# Patient Record
Sex: Female | Born: 1935 | Race: Asian | Hispanic: No | Marital: Married | Smoking: Never smoker
Health system: Southern US, Community
[De-identification: ages and names within clinical notes are randomized; demographics above are authoritative.]

---

## 2009-05-23 ENCOUNTER — Ambulatory Visit: Payer: Self-pay | Admitting: Family Medicine

## 2009-07-09 ENCOUNTER — Ambulatory Visit: Payer: Self-pay | Admitting: Gastroenterology

## 2009-09-12 ENCOUNTER — Ambulatory Visit: Payer: Self-pay | Admitting: Otolaryngology

## 2010-08-09 ENCOUNTER — Ambulatory Visit: Payer: Self-pay | Admitting: Unknown Physician Specialty

## 2010-08-22 ENCOUNTER — Inpatient Hospital Stay: Payer: Self-pay | Admitting: Unknown Physician Specialty

## 2010-10-06 ENCOUNTER — Emergency Department: Payer: Self-pay | Admitting: Emergency Medicine

## 2011-09-10 ENCOUNTER — Ambulatory Visit: Payer: Self-pay | Admitting: Orthopedic Surgery

## 2011-10-06 ENCOUNTER — Ambulatory Visit: Payer: Self-pay | Admitting: Orthopedic Surgery

## 2011-10-06 LAB — PROTIME-INR
INR: 0.9
Prothrombin Time: 12.8 secs (ref 11.5–14.7)

## 2011-10-06 LAB — APTT: Activated PTT: 27.9 secs (ref 23.6–35.9)

## 2011-10-06 LAB — BASIC METABOLIC PANEL
Anion Gap: 9 (ref 7–16)
BUN: 19 mg/dL — ABNORMAL HIGH (ref 7–18)
Calcium, Total: 9.9 mg/dL (ref 8.5–10.1)
Chloride: 104 mmol/L (ref 98–107)
EGFR (African American): 49 — ABNORMAL LOW
Glucose: 111 mg/dL — ABNORMAL HIGH (ref 65–99)
Osmolality: 280 (ref 275–301)
Potassium: 4.7 mmol/L (ref 3.5–5.1)
Sodium: 139 mmol/L (ref 136–145)

## 2011-10-06 LAB — SEDIMENTATION RATE: Erythrocyte Sed Rate: 9 mm/hr (ref 0–30)

## 2011-10-06 LAB — CBC
HCT: 38.4 % (ref 35.0–47.0)
HGB: 13 g/dL (ref 12.0–16.0)
MCHC: 33.9 g/dL (ref 32.0–36.0)
Platelet: 242 10*3/uL (ref 150–440)
RDW: 14.2 % (ref 11.5–14.5)

## 2011-10-06 LAB — MRSA PCR SCREENING

## 2011-10-23 ENCOUNTER — Inpatient Hospital Stay: Payer: Self-pay | Admitting: Orthopedic Surgery

## 2011-10-24 LAB — BASIC METABOLIC PANEL
Anion Gap: 8 (ref 7–16)
BUN: 19 mg/dL — ABNORMAL HIGH (ref 7–18)
Co2: 25 mmol/L (ref 21–32)
EGFR (African American): 50 — ABNORMAL LOW
EGFR (Non-African Amer.): 43 — ABNORMAL LOW
Glucose: 112 mg/dL — ABNORMAL HIGH (ref 65–99)
Osmolality: 279 (ref 275–301)
Sodium: 138 mmol/L (ref 136–145)

## 2011-10-24 LAB — HEMOGLOBIN: HGB: 9.9 g/dL — ABNORMAL LOW (ref 12.0–16.0)

## 2011-10-28 LAB — PATHOLOGY REPORT

## 2012-01-29 ENCOUNTER — Ambulatory Visit: Payer: Self-pay | Admitting: Family Medicine

## 2012-02-09 ENCOUNTER — Ambulatory Visit: Payer: Self-pay | Admitting: Family Medicine

## 2012-04-05 ENCOUNTER — Ambulatory Visit: Payer: Self-pay | Admitting: Orthopedic Surgery

## 2013-05-23 ENCOUNTER — Ambulatory Visit: Payer: Self-pay | Admitting: Family Medicine

## 2014-06-06 NOTE — Op Note (Signed)
PATIENT NAME:  Kristine Parks, Kristine Parks MR#:  [id number] DATE OF BIRTH:  1934-09-09  DATE OF PROCEDURE:  10/23/2011  PREOPERATIVE DIAGNOSIS: Right knee osteoarthritis.   POSTOPERATIVE DIAGNOSIS: Right knee osteoarthritis.   PROCEDURE: Right total knee replacement.   SURGEON: Leitha SchullerMichael J. Terrah Decoster, MD   ANESTHESIA: Spinal.   DESCRIPTION OF PROCEDURE: The patient was brought to the operating room and after adequate spinal anesthesia was obtained, the right leg was prepped and draped in the usual sterile fashion with a tourniquet applied to the upper thigh and a Alvarado legholder utilized. After patient identification and time-out procedures were completed, the leg was exsanguinated with an Esmarch and the tourniquet raised to 275 mmHg. A midline skin incision was made with the knee in flexion followed by a medial parapatellar arthrotomy. Inspection revealed eburnated bone in the medial compartment with significant bone loss of the medial femoral condyle. There was also loss of cartilage on the lateral condyles, femoral and tibial and significant patellar degenerative change as well. The anterior cruciate ligament was excised along with the infrapatellar fat pad and the proximal tibia was exposed to allow for application of the Medacta cutting block. After checking alignment, the proximal tibial cut was carried out and the menisci excised. The femur was exposed in a similar fashion and the cutting block applied. Distal femoral cut was carried out with pins placed in the distal femur for rotational alignment. The 2 cutting guide was applied and anterior, posterior, and chamfer cuts carried out without notching. At this point, the 2 tibia was placed and placed in the appropriate rotation. The proximal drill hole was made along with the keel into the tibia. A 10-mm insert was placed along with a 2 femur. There was some slight laxity in flexion and extension. Going up to the size 12 insert there was excellent stability in  mid flexion, flexion and extension. At this point, the distal femoral drill holes were made along with the notch cut in the trochlea. The patella was cut with the guide and sized to a size 1 patella with three drill holes being made, good coverage with the #1 patella. All trial components were removed at this time as there was excellent stability in flexion and extension and good patellar tracking with no-touch technique. The posterior capsule and arthrotomy were then injected with 0.25% Sensorcaine with epinephrine 30 mL, 15 mg Toradol, and 10 mg of morphine. The bony surfaces were thoroughly irrigated and dried. The tibial component was cemented into place first with excess cement removed followed by placement of the polyethylene component. The femoral component was cemented into place along with the patellar button. Excess cement was removed and after the cement had set, the knee was thoroughly irrigated. There was excellent tracking. The tourniquet was let down for hemostasis and the arthrotomy repaired using a heavy quill suture, 2-0 quill subcutaneously, and skin staples. Xeroform, 4 x 4's, Webril, Ace wrap, and Polar Care device were applied. The patient was then sent to the recovery room in stable condition.   ESTIMATED BLOOD LOSS: 50 mL.  TOURNIQUET TIME:  67 minutes at 275 mmHg.   SPECIMENS: Cut ends of bone.   CONDITION: Stable to recovery room.   COMPLICATIONS: None.   ____________________________ Leitha SchullerMichael J. Eliot Popper, MD mjm:bjt D: 10/23/2011 23:46:36 ET T: 10/24/2011 10:14:03 ET JOB#: [id number]  cc: Leitha SchullerMichael J. Ming Mcmannis, MD, <Dictator> Leitha SchullerMICHAEL J Eunique Balik MD ELECTRONICALLY SIGNED 10/27/2011 7:33

## 2014-06-06 NOTE — Discharge Summary (Signed)
PATIENT NAME:  Kristine Parks, PRAFULLA N MR#:  [id number] DATE OF BIRTH:  [birth year]  DATE OF ADMISSION:  10/23/2011 DATE OF DISCHARGE:  10/27/2011  ADMITTING DIAGNOSIS: Right knee osteoarthritis.   DISCHARGE DIAGNOSIS: Right knee osteoarthritis.   PROCEDURE: Right total knee replacement.   SURGEON: Leitha SchullerMichael J. Menz, MD  ANESTHESIA: Spinal.   ESTIMATED BLOOD LOSS: 50 mL.  TOURNIQUET TIME: 67 minutes at 275 mmHg.   SPECIMENS: Cut ends of bone.   CONDITION: Stable to recovery room.   COMPLICATIONS: None.   HISTORY: The patient is a 80 year old female who underwent a successful left total knee replacement on 08/22/2010 by Dr. Gerrit Heckaliff. She has done very well with the left knee, but she continues to have right knee pain. Over the last year, the right knee pain has progressively gotten worse. Pain is mostly on the inside part of her knee. She has not had any relief with cortisone, Synvisc, anti-inflammatories or narcotic medications. The patient's pain is severe. The pain has limited her activities of daily living such as going to the store and taking care of household chores. The patient has pain with rest as well as with activity. She has had a My Knee CT which confirmed severe right knee osteoarthritis.   PHYSICAL EXAMINATION: GENERAL: Well developed, well nourished female in no apparent distress. Normal affect. Normal gait. No antalgic component. HEENT: Head is normocephalic, atraumatic. Pupils equal are round and reactive to light. She has upper and lower dentures. HEART: Examination of the heart reveals regular rate and rhythm. There is no murmur. There is normal apical pulse. LUNGS: Lungs are clear to auscultation. There is no wheeze, rhonchi, or crackles. There is normal expansion of bilateral chest walls. RIGHT LOWER EXTREMITY: Examination of the right knee shows no effusion, no erythema, and no warmth. No bony abnormalities are noted. There is a varus deformity that is not passively correctable.  She is tender along the medial joint line and nontender along the lateral joint line. There is no noted posterior joint effusion. She has range of motion of 5 to 120 degrees. There is some crepitus on the inside aspect of the knee with flexion and extension. The patient has a negative McMurray's test. There is no retropatellar discomfort. The patient has a negative patella stress test. There is no instability of the knee. The patient has a negative valgus stress test and negative varus stress test. The patient has a negative Lachman test. NEUROLOGIC: There is good quad control. There is no known atrophy. The patient has a negative straight leg raise. The patient has normal sensation to light touch. VASCULAR: The patient has less than 2 second capillary refill. The dorsalis pedis and posterior tibial pulses are intact.   HOSPITAL COURSE: The patient was admitted to the hospital on 10/23/2011. She had surgery that same day and was brought to the orthopedic floor from the PAC-U in stable condition. On postoperative day one, the patient did suffer some dry heaving and nausea, but it was controlled with Zofran and Reglan. KUB was performed which showed negative for any obstructions. The patient was continued on Zofran which controlled her nausea and vomiting and allowed her to have adequate p.o. intake. Throughout the patient's stay she progressed well with physical therapy as well. The patient's vital signs were stable, the patient's labs were stable. On 10/27/2011, the patient was stable and ready for discharge home with home physical therapy.   DISCHARGE INSTRUCTIONS:  The patient may gradually increase weight-bearing on the affected extremity. She  needs to elevate the effected foot and leg on two pillows with the foot higher than the knee and knee-high TED hose on both legs and remove at bedtime, replace when arising the next morning.   Diet: She may resume a regular diet.   Medications: Lovenox 40 mg  subcutaneous once a day and resume typical home medications.   Pain medications: Ultram 1 to 2 tablets every six hours as needed for pain as well as oxycodone 5 to 10 mg every four hours as needed for pain.   Wound care: Continue using the Polar Care unit maintaining temperature between 40 and 50 degrees Fahrenheit. Do not get the dressing or bandage wet or dirty. Call [facility] orthopedics if the dressing gets any water under it. Leave the dressing on.   Symptoms to report: Call [facility] if any of the following occur - bright red bleeding from the incision wound, fever above 101.5 degrees, redness, swelling, or drainage at the incision. Call [facility] orthopedics if you experience any increased leg pain, numbness or weakness in your legs or bowel or bladder symptoms.   REFERRALS: Home physical therapy has been arranged for continuation of rehab program. She is to call [facility] if she has not made contact with them within 48 hours of her return home. She has a follow-up appointment with [facility] in two weeks. She needs to call and schedule an appointment.   DISCHARGE MEDICATIONS:  1. Diovan 160 mg oral tablet one tablet orally once a day at bedtime.  2. Omeprazole 20 mg oral delayed-release tablet one tablet orally once a day in the morning.  3. Amlodipine 5 mg oral tablet one tablet orally once a day at bedtime. 4. Digoxin 125 mcg oral tablet one tablet orally once a day at bedtime. 5. Timolol hemihydrate 0.5% ophthalmic solution one drop to each affected eye once a day at bedtime.  6. Atorvastatin 40 mg oral tablet one tablet orally once a day at bedtime. ____________________________ Evon Slack, PA-C tcg:slb D: 10/27/2011 07:54:01 ET T: 10/28/2011 11:58:30 ET JOB#: [id number]  cc: Evon Slack, PA-C, <Dictator> Evon Slack [place] ELECTRONICALLY SIGNED 11/18/2011 13:48

## 2014-07-06 ENCOUNTER — Other Ambulatory Visit: Payer: Self-pay | Admitting: Family Medicine

## 2014-07-06 DIAGNOSIS — Z0000 Encounter for general adult medical examination without abnormal findings: Secondary | ICD-10-CM

## 2014-07-20 ENCOUNTER — Ambulatory Visit: Payer: Self-pay | Attending: Family Medicine

## 2015-01-25 ENCOUNTER — Other Ambulatory Visit: Payer: Self-pay | Admitting: Podiatry

## 2015-01-25 DIAGNOSIS — R600 Localized edema: Secondary | ICD-10-CM

## 2015-01-26 ENCOUNTER — Ambulatory Visit
Admission: RE | Admit: 2015-01-26 | Discharge: 2015-01-26 | Disposition: A | Discharge status: Home / Self Care | Payer: Medicare Other | Source: Ambulatory Visit | Attending: Podiatry | Admitting: Podiatry

## 2015-01-26 DIAGNOSIS — R600 Localized edema: Secondary | ICD-10-CM | POA: Insufficient documentation

## 2016-04-21 ENCOUNTER — Emergency Department
Admission: EM | Admit: 2016-04-21 | Discharge: 2016-04-21 | Disposition: A | Discharge status: Home / Self Care | Payer: Medicare Other | Attending: Emergency Medicine | Admitting: Emergency Medicine

## 2016-04-21 ENCOUNTER — Encounter: Payer: Self-pay | Admitting: Emergency Medicine

## 2016-04-21 DIAGNOSIS — F4321 Adjustment disorder with depressed mood: Secondary | ICD-10-CM

## 2016-04-21 DIAGNOSIS — F4320 Adjustment disorder, unspecified: Secondary | ICD-10-CM | POA: Insufficient documentation

## 2016-04-21 MED ORDER — LORAZEPAM 0.5 MG PO TABS: 0.5000 mg | ORAL_TABLET | Freq: Three times a day (TID) | ORAL | 0 refills | Status: AC | PRN

## 2016-04-21 MED ORDER — LORAZEPAM 0.5 MG PO TABS
0.5000 mg | ORAL_TABLET | Freq: Once | ORAL | Status: AC
  Administered 2016-04-21: 0.5 mg via ORAL
  Filled 2016-04-21: qty 1

## 2016-04-21 NOTE — ED Notes (Signed)
Pt up to bathroom with minimal assist, co feeling weak but states ready to go back to CCU and be with husband. Pt is calm at this time without co pain.

## 2016-04-21 NOTE — ED Notes (Signed)
Pt still hysterical. Not gagging or nauseated but still very anxious. Family remains at bedside. Attempted hindi interpretor via phone but they are unable to understand pt. Used interpretor to make sure son understands care even though speaks english.

## 2016-04-21 NOTE — ED Triage Notes (Signed)
Pt brought from ICU hysterical because her husband just passed away. Pt has been screaming and retching (not productive) since she arrived in triage. Pt is alert & accompanied by her sons.

## 2016-04-21 NOTE — ED Notes (Signed)
Param (grandson) [phone]

## 2016-04-21 NOTE — Discharge Instructions (Signed)
You have been seen in the Emergency Department (ED) today for grief and the death of your husband. We are sorry you have had to go through this.  Follow up with your doctor as soon as possible regarding today's ED visit.   Please follow up any other recommendations and clinic appointments provided by the psychiatry team that saw you in the Emergency Department.  No driving today.

## 2016-04-21 NOTE — ED Notes (Signed)
Report received from Valerie RN. Patient care assumed. Patient/RN introduction complete. Will continue to monitor.  

## 2016-04-21 NOTE — ED Notes (Signed)
Pt sleeping. 

## 2016-04-21 NOTE — ED Notes (Addendum)
Patient discharged to home per MD order. Patient in stable condition, and deemed medically cleared by ED provider for discharge. Discharge instructions reviewed with patient/family using "Teach Back"; verbalized understanding of medication education and administration, and information about follow-up care. Denies further concerns. Instructions given with family at bedside to translate.

## 2016-04-21 NOTE — ED Provider Notes (Addendum)
[facility] Emergency Department Provider Note   ____________________________________________   First MD Initiated Contact with Patient 04/21/16 1557     (approximate)  I have reviewed the triage vital signs and the nursing notes.   HISTORY  Chief Complaint grief  Hindu interpreter utilize, but the patient due to crying is not able to give a history. She appears quite distraught. EM caveat  HPI Kristine Parks is a 82 y.o. female who unfortunately lost her husband today in the NICU. Upon learning of his death, family reports that she became distraught and crying, and speaking frequently about how she will be able to get from one place other, she one have anyone to help her through life.  The patient's son reports that he too is sad about his father's death, but he is concerned because his mother is crying hysterically. She will not calm down.  The patient reports that she is not in pain. She reports the interpreter that she is "extremely upset" and very sad. Denies any chest pain or trouble breathing. No numbness weakness or headache. She continues to talk about how said she is, and how difficult it will be living without her husband. She is not suicidal or homicidal. She is grieving according to her family.   History reviewed. No pertinent past medical history. High blood pressure according to family There are no active problems to display for this patient.   History reviewed. No pertinent surgical history.  Prior to Admission medications   Medication Sig Start Date End Date Taking? Authorizing Provider  LORazepam (ATIVAN) 0.5 MG tablet Take 1 tablet (0.5 mg total) by mouth every 8 (eight) hours as needed for anxiety. 04/21/16 04/23/16  Sharyn Creamer, MD  Takes blood pressure medicine according to family.  Allergies Patient has no known allergies.  History reviewed. No pertinent family history.  Social History Social History  Substance Use Topics    . Smoking status: Never Smoker  . Smokeless tobacco: Never Used  . Alcohol use No    Review of Systems  She has not been sick recently. Family reports she was in her normal health except for being anxious about her husband, and then suddenly became distraught when learning he had died.    ____________________________________________   PHYSICAL EXAM:  VITAL SIGNS: ED Triage Vitals [04/21/16 1541]  Enc Vitals Group     BP (!) 199/52     Pulse Rate 72     Resp 18     Temp 98.2 F (36.8 C)     Temp Source Oral     SpO2 99 %     Weight 100 lb (45.4 kg)     Height 4\' 10"  (1.473 m)     Head Circumference      Peak Flow      Pain Score      Pain Loc      Pain Edu?      Excl. in GC?     Constitutional: Alert and oriented. Very distraught, tearful and crying.  Eyes: Conjunctivae are normal. PERRL. EOMI. Head: Atraumatic. Nose: No congestion/rhinnorhea. Mouth/Throat: Mucous membranes are moist.  Oropharynx non-erythematous. Neck: No stridor.   Cardiovascular: Normal rate, regular rhythm. Grossly normal heart sounds.  Good peripheral circulation. Respiratory: Normal respiratory effort.  No retractions. Lungs CTAB. Gastrointestinal: Soft and nontender. No distention.  Musculoskeletal: No lower extremity tenderness nor edema.  No joint effusions. Moves all extremities normally with normal strength. Neurologic:  Normal speech and language and  according to her family as there is a language barrier for me. No gross focal neurologic deficits are appreciated. No gait instability. Skin:  Skin is warm, dry and intact. No rash noted. Psychiatric: Mood and affect are tearful and rather distraught.  ____________________________________________   LABS (all labs ordered are listed, but only abnormal results are displayed)  Labs Reviewed - No data to display ____________________________________________  EKG  Reviewed and interpreted by me and 1625 Rate 90 Care is a QTc 4:30 Some  baseline artifact Normal sinus rhythm, no evidence of ischemia or ectopy noted. Of note the patient denies any chest pain or trouble breathing or other cardiac complaint. She is however crying at the time EKG is performed. ____________________________________________  RADIOLOGY   ____________________________________________   PROCEDURES  Procedure(s) performed: None  Procedures  Critical Care performed: No  ____________________________________________   INITIAL IMPRESSION / ASSESSMENT AND PLAN / ED COURSE  Pertinent labs & imaging results that were available during my care of the patient were reviewed by me and considered in my medical decision making (see chart for details).  Patient presents tearful crying and very emotionally upset suddenly after learning the death of her husband.  Family supportive of her. She denies any acute medical conditions the interpreter, but feels very very sad about the loss of her husband. She has not exhibited any acute psychiatric symptoms such as abnormal behavior or suicidal ideation. She denies any cardiac or pulmonary complaint. There is obvious that she is obviously grieving the loss of her husband severely at this time.  Clinical Course as of Apr 22 2002  Mon Apr 21, 2016  1729 Patient is very tearful, continues to be distraught. She has calm some and is no longer gagging, and appears calmer but still very upset. Family requesting some additional medicine to help her, and she is fully alert I will give her additional dose of Ativan going slowly given her age, weight, and wish to avoid side effects from this medication.  [MQ]  1926 Patient calm, vitals improved. Resting comfortably. Much improved from previous. She reports she sat, but no longer appears in significant grief or distress.  [MQ]    Clinical Course User Index [MQ] Sharyn Creamer, MD    ----------------------------------------- 8:02 PM on  04/21/2016 -----------------------------------------  The patient is calm, resting comfortably with improvement vital signs. She has no complaints right now, she does feel sad but this is to be expected. I discussed with her family including her son, and he discussed with the patient and she does not wish for an interpreter for discharge, but wishes to be discharged now to go with her son with the plan to likely go visit the ICU again. I will prescribe her a few tablets of Ativan as an as needed for the next couple of days if she develops anxiety grieving recurrence of her symptoms today.  Much improved. Vital signs improved and stable. Will follow-up with her regular doctor and has support of her close family. ____________________________________________   FINAL CLINICAL IMPRESSION(S) / ED DIAGNOSES  Final diagnoses:  Grief reaction      NEW MEDICATIONS STARTED DURING THIS VISIT:  New Prescriptions   LORAZEPAM (ATIVAN) 0.5 MG TABLET    Take 1 tablet (0.5 mg total) by mouth every 8 (eight) hours as needed for anxiety.     Note:  This document was prepared using Dragon voice recognition software and may include unintentional dictation errors.     Sharyn Creamer, MD 04/21/16 2004  Please note a Hindi interpreter was utilized during my examination and history taking, with the addition of assistance from a patient's family as well    Sharyn CreamerMark Quale, MD 04/21/16 2008

## 2018-12-06 ENCOUNTER — Other Ambulatory Visit: Payer: Self-pay

## 2018-12-06 ENCOUNTER — Encounter: Payer: Medicare Other | Attending: Family Medicine | Admitting: Dietician

## 2018-12-06 VITALS — Ht 60.0 in | Wt 103.6 lb

## 2018-12-06 DIAGNOSIS — E119 Type 2 diabetes mellitus without complications: Secondary | ICD-10-CM | POA: Diagnosis not present

## 2018-12-06 DIAGNOSIS — I10 Essential (primary) hypertension: Secondary | ICD-10-CM | POA: Insufficient documentation

## 2018-12-06 NOTE — Progress Notes (Signed)
Medical Nutrition Therapy: Visit start time: 1330  end time: 1415  Assessment:  Diagnosis: Type 2 diabetes, HTN Past medical history: same as above Psychosocial issues/ stress concerns: none  Preferred learning method:  . Auditory   Current weight: 103.6lbs Height: 4'6"  Medications, supplements: reconciled list in medical record  Progress and evaluation:  Patient has been checking BG in evening daily; all results have been under 150mg /dl.  Patient reports eating small meals and generally avoids sweets.  Patient follows vegetarian diet and is eating only small portions of starches, larger portions of low-carb vegetables.   Physical activity: ADLs  Dietary Intake:  Usual eating pattern includes 3 meals and 1 snacks per day. Dining out frequency: 0 meals per week.  Breakfast: milk Snack: none Lunch: chapati -- pita bread, sometimes rice, veg cauliflower, spinach with spices does not eat meat Snack: dried chickpeas, spiced Supper: same as lunch Snack: none Beverages: milk, water, juice  Nutrition Care Education: Topics covered: diabetes Basic nutrition: basic food groups, appropriate nutrient balance, appropriate meal and snack schedule, general nutrition guidelines    Diabetes:  goals for BGs, appropriate meal and snack schedule, importance of controlling portions of high-carbohydrate foods to 1/4 plate or less; role of fiber, protein, fat; advised avoiding/ limiting deep fried foods and sweets  Hypertension:  identifying food sources of potassium, magnesium   Nutritional Diagnosis:  [place]-2.2 Altered nutrition-related laboratory As related to Type 2 diabetes, hypertension.  As evidenced by patient with recent HbA1C of 5.7% while taking diabetes medication, recent elevated BP.  Intervention:   Instruction as noted above.  Patient seems to be following a healthy eating pattern that should promote healthy blood sugars and blood pressure.   Patient's son requested a blood sugar  check; result was 76mg /dl at 2:15pm -- advised eating a snack to avoid low BG.   Patient had another appointment elsewhere so visit was kept to 45 minutes.   Education Materials given:  . Plate Planner with food lists . [place] [employer]: [employer] Guide, Gujarati Cuisine  Learner/ who was taught:  . Patient  . Family member: son   Level of understanding: Marland Kitchen Verbalizes/ demonstrates competency   Demonstrated degree of understanding via:   Teach back Learning barriers: . Language: patient speaks Gujarati; interpreter Reymundo Poll assisted with visit   Willingness to learn/ readiness for change: . Eager, change in progress  Monitoring and Evaluation:  Dietary intake, exercise, BG control, BP control, and body weight      follow up: prn

## 2020-01-03 ENCOUNTER — Ambulatory Visit: Payer: Medicare Other | Admitting: Gastroenterology

## 2020-03-19 ENCOUNTER — Other Ambulatory Visit: Payer: Self-pay

## 2020-03-19 ENCOUNTER — Ambulatory Visit (INDEPENDENT_AMBULATORY_CARE_PROVIDER_SITE_OTHER): Payer: Medicare Other | Admitting: Urology

## 2020-03-19 VITALS — BP 187/90 | HR 68 | Ht 60.0 in | Wt 97.0 lb

## 2020-03-19 DIAGNOSIS — R339 Retention of urine, unspecified: Secondary | ICD-10-CM | POA: Diagnosis not present

## 2020-03-19 DIAGNOSIS — R39198 Other difficulties with micturition: Secondary | ICD-10-CM | POA: Diagnosis not present

## 2020-03-19 LAB — MICROSCOPIC EXAMINATION
Bacteria, UA: NONE SEEN
RBC: NONE SEEN /hpf (ref 0–2)

## 2020-03-19 LAB — URINALYSIS, COMPLETE
Bilirubin, UA: NEGATIVE
Glucose, UA: NEGATIVE
Ketones, UA: NEGATIVE
Leukocytes,UA: NEGATIVE
Nitrite, UA: NEGATIVE
Protein,UA: NEGATIVE
RBC, UA: NEGATIVE
Specific Gravity, UA: 1.01 (ref 1.005–1.030)
Urobilinogen, Ur: 0.2 mg/dL (ref 0.2–1.0)
pH, UA: 5 (ref 5.0–7.5)

## 2020-03-19 LAB — BLADDER SCAN AMB NON-IMAGING: Scan Result: 14

## 2020-03-19 NOTE — Progress Notes (Signed)
03/19/2020 1:09 PM   Kristine Parks 1934-08-06 [id number]  Referring provider: Cyndia Diver, PA-C [address] [place],  [place] [address]  No chief complaint on file.   HPI: Consulted to assess the patient for symptoms.  Translator was utilized.  History was difficult.  It appears she has a slower flow was stopping starting but she is not bothered a lot by it.  She is continent.  She voids every 2 hours during the day gets up once or twice a night.  She is continent.  She is on oral hypoglycemics  No history of bladder surgery or infections   PMH: No past medical history on file.  Surgical History: No past surgical history on file.  Home Medications:  Allergies as of 03/19/2020      Reactions   Doxycycline Other (See Comments)   Tramadol Other (See Comments)      Medication List       Accurate as of March 19, 2020  1:09 PM. If you have any questions, ask your nurse or doctor.        amLODipine 5 MG tablet Commonly known as: NORVASC Take 5 mg by mouth 2 (two) times daily.   aspirin EC 81 MG tablet Take by mouth.   atorvastatin 40 MG tablet Commonly known as: LIPITOR TAKE 1 TABLET BY MOUTH EVERY DAY   cetirizine 10 MG tablet Commonly known as: ZYRTEC Take by mouth.   digoxin 0.125 MG tablet Commonly known as: LANOXIN TAKE 1 TABLET (0.125 MG TOTAL) BY MOUTH ONCE DAILY.   fluticasone 50 MCG/ACT nasal spray Commonly known as: FLONASE ONE SPRAYS IN EACH NOSTRIL ONCE DAILY   gabapentin 100 MG capsule Commonly known as: NEURONTIN TAKE 1 CAPSULE BY MOUTH THREE TIMES A DAY   glucose blood test strip USE 1 EACH 2 (TWO) TIMES DAILY. ONE TOUCH ULTRA TEST STRIPS. DX CODE E11.9   hydrALAZINE 25 MG tablet Commonly known as: APRESOLINE Take 25 mg by mouth daily.   metFORMIN 500 MG tablet Commonly known as: GLUCOPHAGE Take 500 mg by mouth 2 (two) times daily with a meal.   metoprolol succinate 50 MG 24 hr tablet Commonly known as:  TOPROL-XL Take by mouth.   omeprazole 20 MG capsule Commonly known as: PRILOSEC TAKE 1 CAPSULE BY MOUTH EVERY DAY   triamcinolone 0.1 % Commonly known as: KENALOG Apply topically.   valsartan-hydrochlorothiazide 160-12.5 MG tablet Commonly known as: DIOVAN-HCT TAKE 1 TABLET BY MOUTH EVERY DAY   Wixela Inhub 100-50 MCG/DOSE Aepb Generic drug: Fluticasone-Salmeterol INHALE 1 PUFF INTO THE LUNGS EVERY 12 HOURS       Allergies:  Allergies  Allergen Reactions  . Doxycycline Other (See Comments)  . Tramadol Other (See Comments)    Family History: No family history on file.  Social History:  reports that she has never smoked. She has never used smokeless tobacco. She reports that she does not drink alcohol and does not use drugs.  ROS:                                        Physical Exam: There were no vitals taken for this visit.  Constitutional:  Alert and oriented, No acute distress. HEENT: [place] AT, moist mucus membranes.  Trachea midline, no masses. Cardiovascular: No clubbing, cyanosis, or edema. Respiratory: Normal respiratory effort, no increased work of breathing. GI: Abdomen is soft, nontender, nondistended, no  abdominal masses GU: No CVA tenderness.  Narrow introitus.  She was a bit nervous.  No prolapse.  Modest atrophy Skin: No rashes, bruises or suspicious lesions. Lymph: No cervical or inguinal adenopathy. Neurologic: Grossly intact, no focal deficits, moving all 4 extremities. Psychiatric: Normal mood and affect.  Laboratory Data: Lab Results  Component Value Date   WBC 9.4 10/06/2011   HGB 10.2 (L) 10/25/2011   HCT 38.4 10/06/2011   MCV 86 10/06/2011   PLT 179 10/24/2011    Lab Results  Component Value Date   CREATININE 1.23 10/24/2011    No results found for: PSA  No results found for: TESTOSTERONE  No results found for: HGBA1C  Urinalysis No results found for: COLORURINE, APPEARANCEUR, LABSPEC, PHURINE, GLUCOSEU,  HGBUR, BILIRUBINUR, KETONESUR, PROTEINUR, UROBILINOGEN, NITRITE, LEUKOCYTESUR  Pertinent Imaging: Urine reviewed.  Urine sent for culture.  Chart reviewed.  PVR 11 ml  Assessment & Plan: Patient has nonspecific flow symptoms and I do not think she is bothered much by them.  She want to talk about other health issues.  There are no diagnoses linked to this encounter.  No follow-ups on file.  Martina Sinner, MD  [facility] [address], [address] [place], [place] [address] [phone]

## 2020-07-31 ENCOUNTER — Other Ambulatory Visit: Payer: Self-pay | Admitting: Otolaryngology

## 2020-07-31 ENCOUNTER — Ambulatory Visit
Admission: RE | Admit: 2020-07-31 | Discharge: 2020-07-31 | Disposition: A | Discharge status: Home / Self Care | Payer: Medicare Other | Source: Ambulatory Visit | Attending: Otolaryngology | Admitting: Otolaryngology

## 2020-07-31 ENCOUNTER — Ambulatory Visit
Admission: RE | Admit: 2020-07-31 | Discharge: 2020-07-31 | Disposition: A | Discharge status: Home / Self Care | Payer: Medicare Other | Attending: Otolaryngology | Admitting: Otolaryngology

## 2020-07-31 DIAGNOSIS — R059 Cough, unspecified: Secondary | ICD-10-CM | POA: Insufficient documentation

## 2020-07-31 DIAGNOSIS — R42 Dizziness and giddiness: Secondary | ICD-10-CM

## 2020-08-13 ENCOUNTER — Ambulatory Visit: Payer: Medicare Other

## 2020-08-23 ENCOUNTER — Other Ambulatory Visit: Payer: Self-pay | Admitting: Otolaryngology

## 2020-08-23 DIAGNOSIS — R1310 Dysphagia, unspecified: Secondary | ICD-10-CM

## 2020-08-23 DIAGNOSIS — R059 Cough, unspecified: Secondary | ICD-10-CM

## 2020-09-28 ENCOUNTER — Other Ambulatory Visit: Payer: Medicare Other

## 2020-10-02 ENCOUNTER — Other Ambulatory Visit: Payer: Self-pay

## 2020-10-02 ENCOUNTER — Ambulatory Visit
Admission: RE | Admit: 2020-10-02 | Discharge: 2020-10-02 | Disposition: A | Discharge status: Home / Self Care | Payer: Medicare Other | Source: Ambulatory Visit | Attending: Otolaryngology | Admitting: Otolaryngology

## 2020-10-02 DIAGNOSIS — R42 Dizziness and giddiness: Secondary | ICD-10-CM | POA: Insufficient documentation

## 2020-10-02 MED ORDER — GADOBUTROL 1 MMOL/ML IV SOLN
4.0000 mL | Freq: Once | INTRAVENOUS | Status: AC | PRN
  Administered 2020-10-02: 4 mL via INTRAVENOUS

## 2020-10-03 ENCOUNTER — Ambulatory Visit
Admission: RE | Admit: 2020-10-03 | Discharge: 2020-10-03 | Disposition: A | Discharge status: Home / Self Care | Payer: Medicare Other | Source: Ambulatory Visit | Attending: Otolaryngology | Admitting: Otolaryngology

## 2020-10-03 DIAGNOSIS — R1310 Dysphagia, unspecified: Secondary | ICD-10-CM | POA: Insufficient documentation

## 2020-10-03 DIAGNOSIS — R059 Cough, unspecified: Secondary | ICD-10-CM | POA: Diagnosis present

## 2020-10-03 NOTE — Therapy (Signed)
[facility] DIAGNOSTIC RADIOLOGY [address] [place], [place], [address] Phone: [phone]   Fax:     Modified Barium Swallow  Patient Details  Name: Kristine Parks MRN: [id number] Date of Birth: 09-Nov-1934 No data recorded  Encounter Date: 10/03/2020   End of Session - 10/03/20 1750     Visit Number 1    Number of Visits 1    Date for SLP Re-Evaluation 10/03/20    SLP Start Time 1345    SLP Stop Time  1402    SLP Time Calculation (min) 17 min    Activity Tolerance Patient tolerated treatment well             No past medical history on file.  No past surgical history on file.  There were no vitals filed for this visit.   Subjective Assessment - 10/03/20 1714     Subjective "She doesn't have trouble swallowing."    Currently in Pain? No/denies             Objective Swallowing Evaluation: Type of Study: MBS-Modified Barium Swallow Study   Patient Details  Name: Kristine Parks MRN: [id number] Date of Birth: [birth year]  Today's Date: 10/03/2020 Time: SLP Start Time (ACUTE ONLY): 1345 -SLP Stop Time (ACUTE ONLY): 1402  SLP Time Calculation (min) (ACUTE ONLY): 17 min    HPI: Patient is an 86 y.o. female with past medical history noted for COPD, esophageal reflux, HLD, HTN, DM, frequent falls. MRI 10/02/20 showed no acute findings, mild chronic microvascular ichemic changes of the white matter. No recent hx PNA; CXR 08/01/20 showed no acute intrathoracic process. Referred by Dr. Andee Poles for MBS. Patient reports frequent coughing with urge to vomit, but denies this occurs when she is eating.   Subjective: Reports feeling a lump in her throat, feels she needs to vomit at times but doesn't    Assessment / Plan / Recommendation  CHL IP CLINICAL IMPRESSIONS 10/03/2020  Clinical Impression Patient presents with normal oropharyngeal swallow. Oral stage is characterized by adequate lip closure, bolus preparation, and  anterior to posterior transit. Swallow initiation is timely at the level of the base of tongue. Pharyngeal stage is noted for adequate tongue base retraction, hyolaryngeal excursion, and pharyngeal constriction. Epiglottic deflection is complete; there is no penetration or aspiration. Pharyngeal stripping wave is complete with no abnormal pharyngeal residue. Amplitude/duration of cricopharyngeus opening is WFL. There is mildly reduced clearance through the cervical esophagus with a small amount of solid retention below the cricopharyngeus. This cleared with subsequent swallow. The proximal esophagus was visualized with no other retention or retrograde bolus flow observed. Consistencies tested were thin liquids x2 cup sips, nectar x1 cup sip, puree x2 heaping tsps, mechanical soft (1/4 graham cracker in applesauce x2), 3 oz sequential boluses thin liquid. SLP educated on reflux modifications and provided handout with this information. Patient would benefit from follow up with GI and may benefit from further assessment of the esophagus. Recommend patient continue regular diet with thin liquids; no further ST indicated at this time.  SLP Visit Diagnosis Dysphagia, unspecified (R13.10)  Impact on safety and function Mild aspiration risk      CHL IP TREATMENT RECOMMENDATION 10/03/2020  Treatment Recommendations No treatment recommended at this time     No flowsheet data found.  CHL IP DIET RECOMMENDATION 10/03/2020  SLP Diet Recommendations Regular solids;Thin liquid  Liquid Administration via Cup  Medication Administration Whole meds with liquid  Compensations Slow rate;Small sips/bites;Follow solids with  liquid  Postural Changes Seated upright at 90 degrees;Remain semi-upright after after feeds/meals (Comment)      CHL IP OTHER RECOMMENDATIONS 10/03/2020  Recommended Consults Consider GI evaluation;Consider esophageal assessment  Oral Care Recommendations Oral care BID  Other Recommendations --       CHL IP FOLLOW UP RECOMMENDATIONS 10/03/2020  Follow up Recommendations None      No flowsheet data found.         CHL IP ORAL PHASE 10/03/2020  Oral Phase WFL    CHL IP PHARYNGEAL PHASE 10/03/2020  Pharyngeal Phase WFL  Pharyngeal- Nectar Teaspoon NT  Pharyngeal Material does not enter airway  Pharyngeal- Nectar Cup [facility]  Pharyngeal Material does not enter airway  Pharyngeal- Nectar Straw NT  Pharyngeal --  Pharyngeal- Thin Teaspoon NT  Pharyngeal --  Pharyngeal- Thin Cup [facility]  Pharyngeal Material does not enter airway  Pharyngeal- Thin Straw NT  Pharyngeal --  Pharyngeal- Puree WFL  Pharyngeal Material does not enter airway  Pharyngeal- Mechanical Soft WFL  Pharyngeal Material does not enter airway     CHL IP CERVICAL ESOPHAGEAL PHASE 10/03/2020  Cervical Esophageal Phase Impaired  Thin Straw --  Puree Mild retention below the cricopharyngeus clears with subsequent swallow  Mechanical Soft Mild retention below the cricopharyngeus clears with subsequent swallow    Rondel Baton, MS, CCC-SLP Speech-Language Pathologist  Arlana Lindau 10/03/2020, 5:50 PM                           Dysphagia, unspecified type - Plan: DG SWALLOW FUNC OP MEDICARE SPEECH PATH, DG SWALLOW FUNC OP MEDICARE SPEECH PATH  Cough - Plan: DG SWALLOW FUNC OP MEDICARE SPEECH PATH, DG SWALLOW FUNC OP MEDICARE SPEECH PATH        Problem List There are no problems to display for this patient.  Rondel Baton, MS, CCC-SLP Speech-Language Pathologist  Arlana Lindau 10/03/2020, 5:50 PM  [facility] DIAGNOSTIC RADIOLOGY [address] [place], [place], [address] Phone: [phone]   Fax:     Name: Kristine Parks MRN: [id number] Date of Birth: Feb 11, 1935

## 2021-11-12 ENCOUNTER — Other Ambulatory Visit: Payer: Self-pay | Admitting: Nephrology

## 2021-11-12 ENCOUNTER — Other Ambulatory Visit (HOSPITAL_COMMUNITY): Payer: Self-pay | Admitting: Nephrology

## 2021-11-12 DIAGNOSIS — I129 Hypertensive chronic kidney disease with stage 1 through stage 4 chronic kidney disease, or unspecified chronic kidney disease: Secondary | ICD-10-CM

## 2021-11-12 DIAGNOSIS — E1122 Type 2 diabetes mellitus with diabetic chronic kidney disease: Secondary | ICD-10-CM

## 2021-11-12 DIAGNOSIS — N1832 Chronic kidney disease, stage 3b: Secondary | ICD-10-CM

## 2021-11-22 ENCOUNTER — Ambulatory Visit
Admission: RE | Admit: 2021-11-22 | Discharge: 2021-11-22 | Disposition: A | Discharge status: Home / Self Care | Payer: Medicare Other | Source: Ambulatory Visit | Attending: Nephrology | Admitting: Nephrology

## 2021-11-22 DIAGNOSIS — N1832 Chronic kidney disease, stage 3b: Secondary | ICD-10-CM | POA: Diagnosis present

## 2021-11-22 DIAGNOSIS — I129 Hypertensive chronic kidney disease with stage 1 through stage 4 chronic kidney disease, or unspecified chronic kidney disease: Secondary | ICD-10-CM | POA: Insufficient documentation

## 2021-11-22 DIAGNOSIS — E1122 Type 2 diabetes mellitus with diabetic chronic kidney disease: Secondary | ICD-10-CM | POA: Diagnosis present

## 2022-11-12 IMAGING — RF DG SWALLOWING FUNCTION
7 series · 19 of 24 positions shown · non-contrast
Comparison: None.

CLINICAL DATA: Dysphagia. Difficulty swallowing

EXAM:
MODIFIED BARIUM SWALLOW
TECHNIQUE: Different consistencies of barium were administered orally to the
patient by the Speech Pathologist. Imaging of the pharynx was
performed in the lateral projection. The radiologist was present in
the fluoroscopy room for this study, providing personal supervision.
FLUOROSCOPY TIME:  Fluoroscopy Time:  1.2 minute
Radiation Exposure Index (if provided by the fluoroscopic device):
8.2 mGy
Number of Acquired Spot Images: 0

[Series 1: cp_standard · 0.08mm/px · 2 of 140 frames shown (1 of 7)]
[frame 22/140]
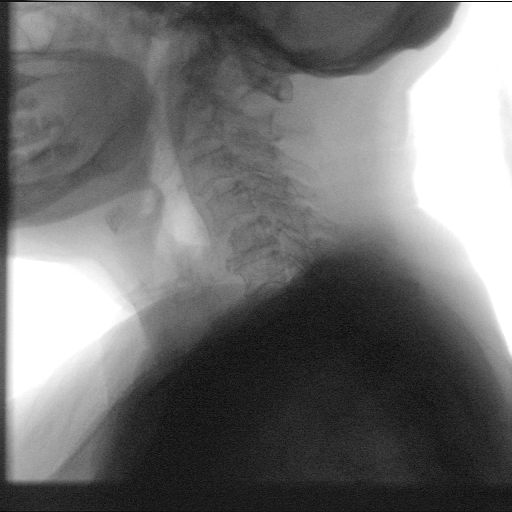
[frame 33/140]
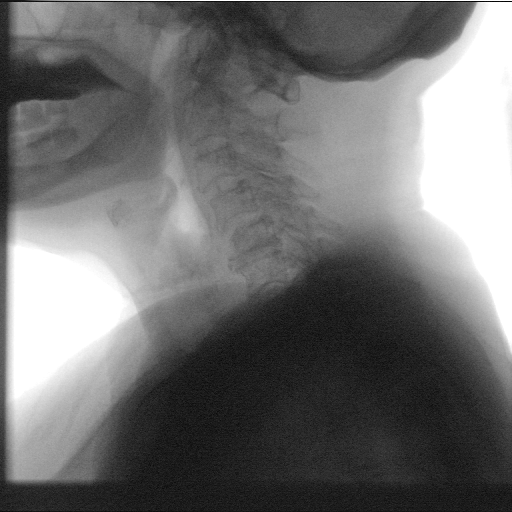

[Series 2: cp_standard · 0.08mm/px · 4 of 171 frames shown (2 of 7)]
[frame 26/171]
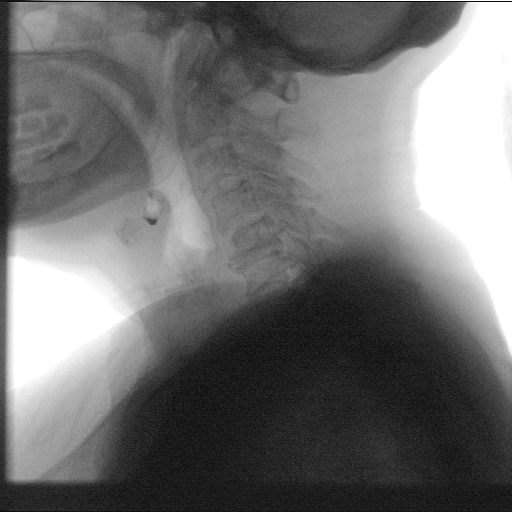
[frame 57/171]
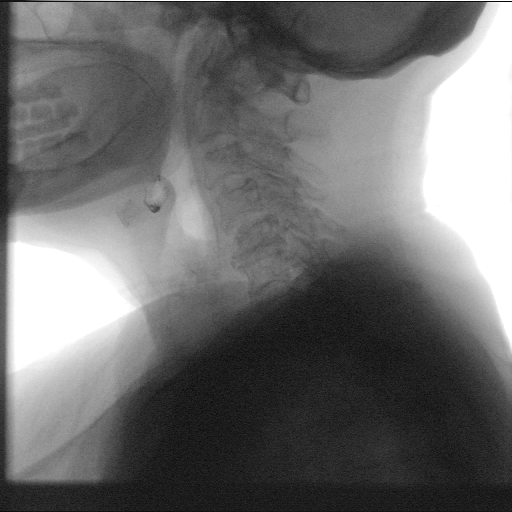
[frame 86/171]
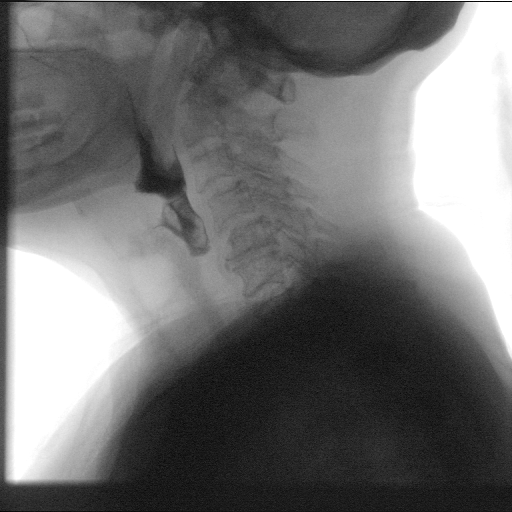
[frame 146/171]
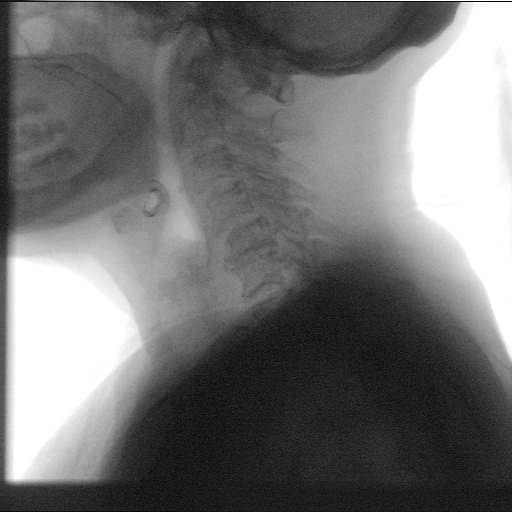

[Series 3: cp_standard · 0.08mm/px · 2 of 127 frames shown (3 of 7)]
[frame 27/127]
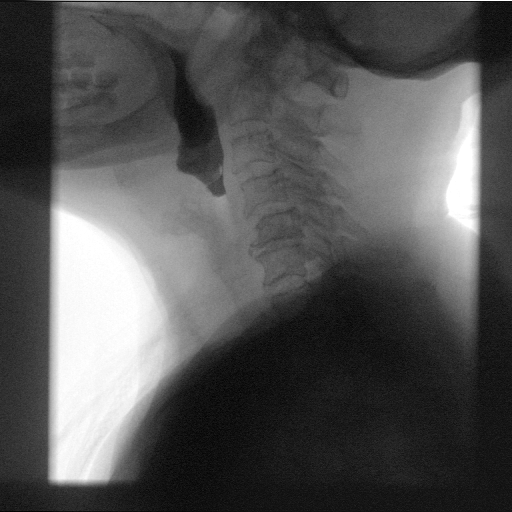
[frame 108/127]
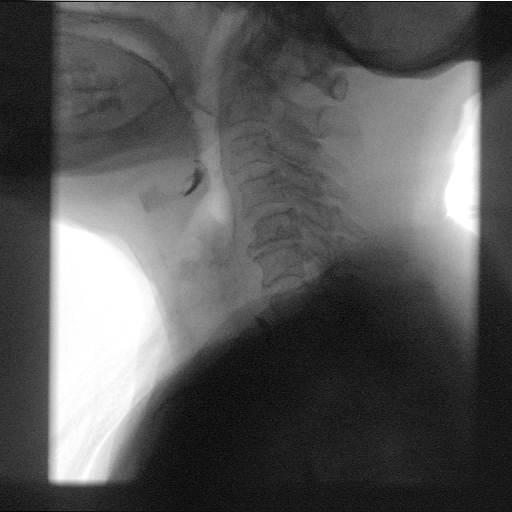

[Series 4: cp_standard · 0.08mm/px · 3 of 323 frames shown (4 of 7)]
[frame 49/323]
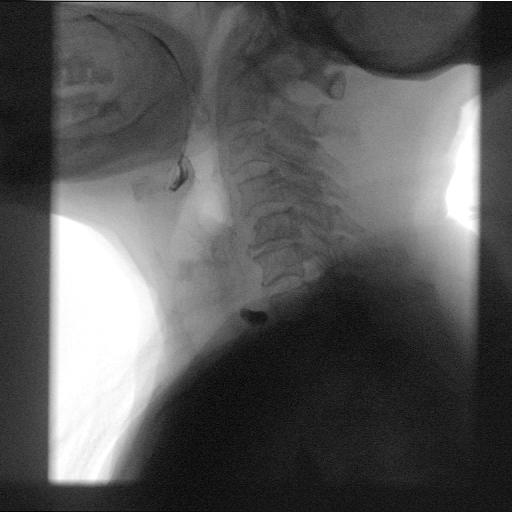
[frame 268/323]
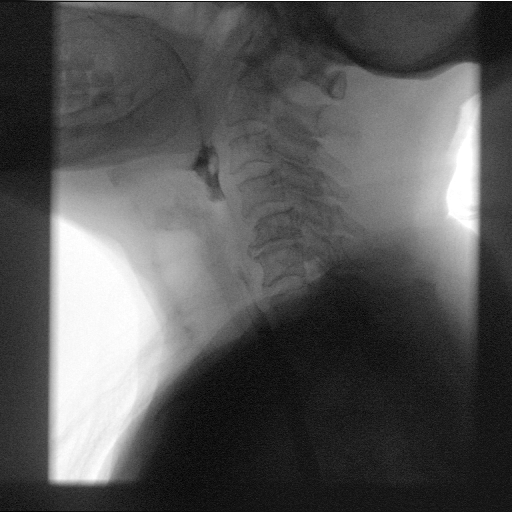
[frame 275/323]
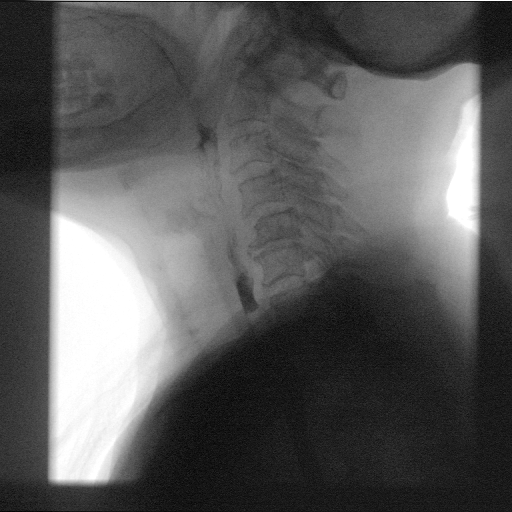

[Series 5: cp_standard · 0.08mm/px · 2 of 230 frames shown (5 of 7)]
[frame 35/230]
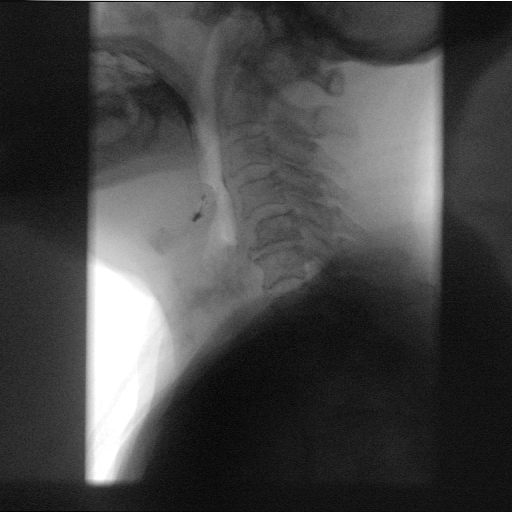
[frame 122/230]
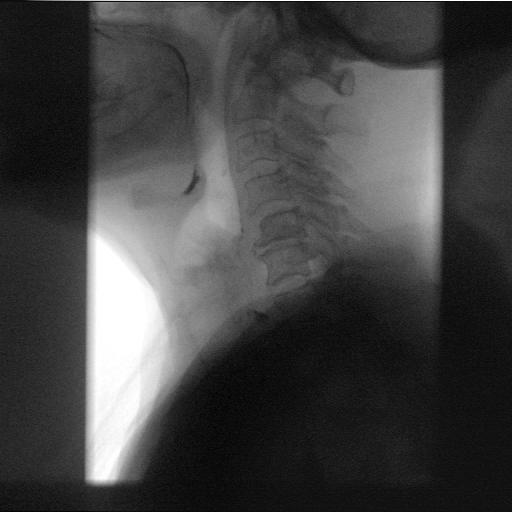

[Series 6: cp_standard · 0.08mm/px · 4 of 316 frames shown (6 of 7)]
[frame 48/316]
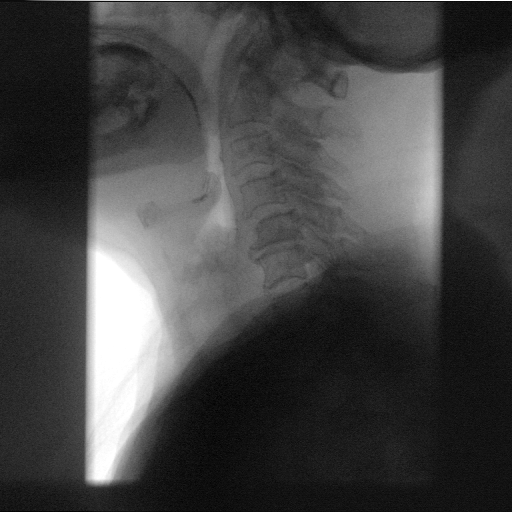
[frame 159/316]
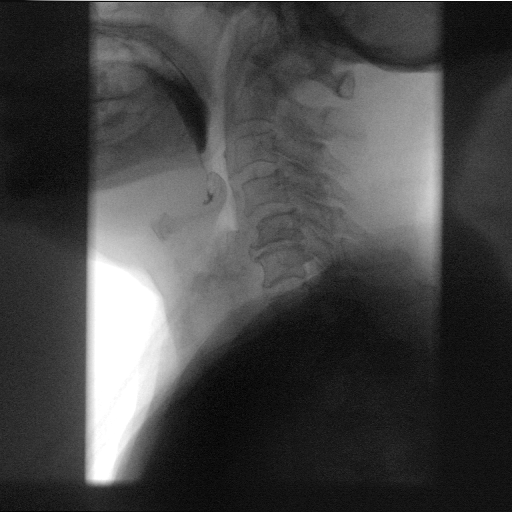
[frame 203/316]
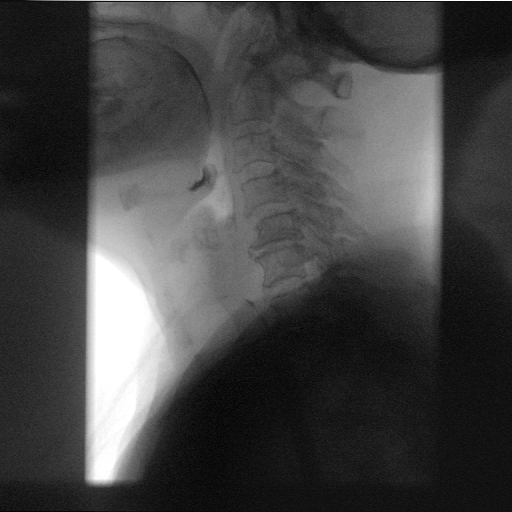
[frame 269/316]
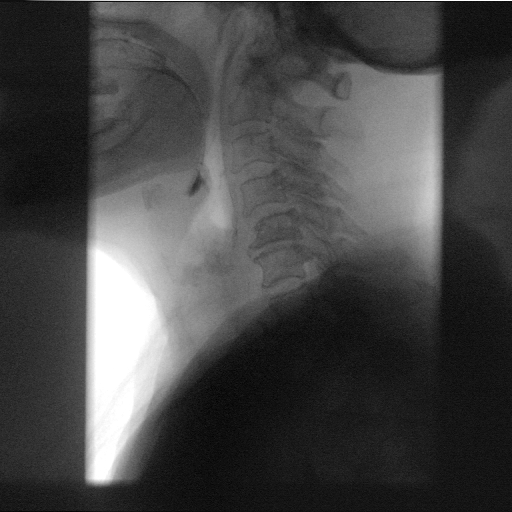

[Series 7: cp_standard · 0.08mm/px · 2 of 174 frames shown (7 of 7)]
[frame 148/174]
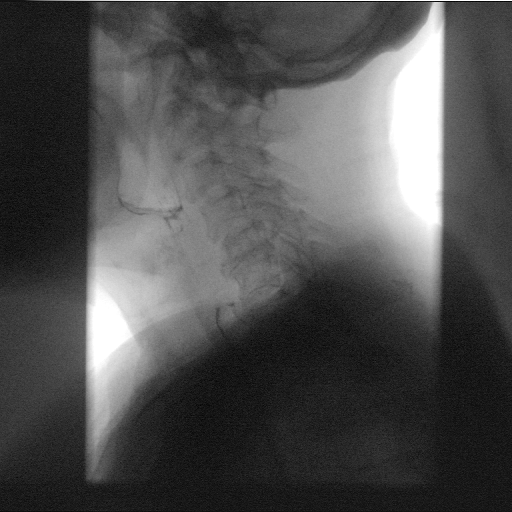
[frame 172/174]
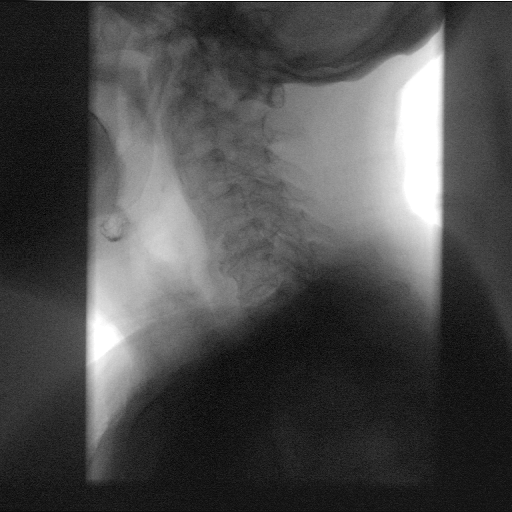

[19 of 24 positions shown; findings below may reference images not displayed]

FINDINGS: Real-time fluoroscopy of the swallowing function was performed with
a speech pathologist present.

Multiple consistencies of barium were administered which included
water, nectar, applesauce, and Michaelgeovanny cracker.

Normal swallow function. No laryngeal penetration or tracheal
aspiration.
IMPRESSION: Modified barium swallow as detailed above.

Please refer to the Speech Pathologists report for complete details
and recommendations.

## 2023-12-03 ENCOUNTER — Ambulatory Visit

## 2023-12-03 DIAGNOSIS — L814 Other melanin hyperpigmentation: Secondary | ICD-10-CM

## 2023-12-03 DIAGNOSIS — D692 Other nonthrombocytopenic purpura: Secondary | ICD-10-CM | POA: Diagnosis not present

## 2023-12-03 DIAGNOSIS — R21 Rash and other nonspecific skin eruption: Secondary | ICD-10-CM | POA: Diagnosis not present

## 2023-12-03 DIAGNOSIS — L281 Prurigo nodularis: Secondary | ICD-10-CM

## 2023-12-03 DIAGNOSIS — L2084 Intrinsic (allergic) eczema: Secondary | ICD-10-CM

## 2023-12-03 MED ORDER — TRIAMCINOLONE ACETONIDE 0.1 % EX OINT
TOPICAL_OINTMENT | CUTANEOUS | 2 refills | Status: AC
Start: 2023-12-03 — End: ?

## 2023-12-03 MED ORDER — CLOBETASOL PROPIONATE 0.05 % EX OINT: TOPICAL_OINTMENT | CUTANEOUS | 5 refills | Status: AC

## 2023-12-03 MED ORDER — TACROLIMUS 0.1 % EX OINT: TOPICAL_OINTMENT | CUTANEOUS | 5 refills | Status: AC

## 2023-12-03 NOTE — Patient Instructions (Signed)
 Start triamcinolone ointment 0.1% twice daily to affected areas of skin. Do not use on face.  Discussed side effect of potent topical steroids including atrophy, dyspigmentation, striae, telangectasia, folliculitis, loss of skin pigment, hair growth, tachyphylaxis, risk of systemic absorption with missuse.   (Stronger steroid) Start clobetasol ointment 0.05% twice daily to affected skin. Do not use on face.  Discussed side effect of super potent topical steroids including atrophy, dyspigmentation, striae, telangectasia, folliculitis, loss of skin pigment, hair growth, tachyphylaxis, risk of systemic absorption with missuse.   (Non-steroid) Start tacrolimus ointment 0.1% twice daily on red, itchy areas at face. Educated about black box warning (and also that recent studies show no increased malignancy risk) and common adverse effects such as burning with application (advised to cool in fridge and only apply to bone-dry skin).   Atopic Dermatitis (Eczema)  PLAN: - Apply medicated ointment/cream to active areas (red/itchy/raised) 2x/day until CLEAR, then stop, restart as needed.  - For mild areas: start triamcinolone ointment twice daily - For severe areas: start clobetasol ointment twice daily - For areas on face: start tacrolimus ointment twice daily - For crusted areas: start mupirocin twice daily  - Hydroxyzine before bed if needed for severe itch:  - Dilute bleach baths 2-3x/week (1/4 cup plain bleach to 1/4 tub water; 1/2 cup of bleach to 1/2 tub of water; 1 teaspoon bleach per gallon of water)   - Moisturize several times a day with an ointment (plain petroleum jelly/Vaseline, Aquaphor, coconut oil) or heavy cream (Vanicream, CereVe, Cetaphil, Eucerin) - Keep baths short (5-86min), limit soap as much as possible (Cetaphil gentle cleanser, vanicream bar, Dove Tip to Toe unscented, Trade Joe oatmeal and honey bar soap), apply moisturizers and medication immediately after  bath/shower  Atopic dermatitis, also called eczema, is a common and chronic skin condition in which the skin appears inflamed, red, itchy and dry. It most commonly affects children.  Atopic dermatitis is most likely caused by a combination of genetic and environmental factors. Genetic causes include differences in the proteins that form the skin barrier. When this barrier is broken down, the skin loses moisture more easily, becoming more dry, easily irritated, and hypersensitive. The skin is also more prone to infection (with bacteria, viruses, or fungi). The immune system in the skin may be different and overreact to environmental triggers such as pet dander and dust mites.  Allergies and asthma may be present more frequently in individuals with atopic dermatitis, but they are not the cause of eczema. Infrequently, when a specific food allergy is identified, eating that food may make atopic dermatitis worse, but it usually is not the cause of the eczema.  In infants, atopic dermatitis often starts as a dry red rash on the cheeks and around the mouth, often made worse by drooling. As children grow older, the rash may be on the arms, legs, or in other areas where they are able to scratch. In teenagers, eczema is often on the inside of the elbows and knees, on the hands and feet, and around the eyes.  There is no cure, but there are recommendations to help manage this skin problem.  TREATMENT  Treatments are aimed at preventing dry skin, treating the rash, improving the itch, and minimizing exposure to triggers.  1. GENTLE SKIN CARE TO PREVENT DRYNESS Bathe daily or every-other-day in order to wash off dirt and other potential irritants (the optimal frequency of bathing is not yet clear). Water should be warm (not hot), and bath time should  be limited to 5-10 minutes. Pat-dry the skin and immediately apply moisturizer while the skin is still slightly damp. The moisturizer provides a seal to hold  the water in the skin. Finding a cream or ointment that the child likes or can tolerate is important, as resistance from the child may make the daily regimen difficult to keep up. The thicker the moisturizer, the better the barrier it generally provides. Ointments are more effective than creams, and creams more so than lotions. Creams are a reasonable option during the summer when thick greasy ointments are uncomfortable.   2. TREATING THE RASH The most commonly used medications are topical corticosteroids ("steroids"). There are many different types of topical corticosteroids that come in different strengths and formulations (for example, ointments, creams, lotions, solutions, gels, oils). Therefore, finding the right combination for the individual is important to treat and to minimize the risk of unwanted side effects from the corticosteroid, such as skin thinning. In general, these topical corticosteroids should be applied as a thin layer and no more than twice daily. It is very unusual to see any side effects when a topical corticosteroid is used as prescribed by your doctor. A relatively newer form of topical medication - in tacrolimus ointment and pimecrolimus cream - is also helpful, particularly in thin-skinned areas such as the eyelids, armpits, and groin.* For severe and treatment-resistant cases of atopic dermatitis, systemic medications may be necessary. They may be associated with serious side effects and therefore require closer monitoring.  *The FDA placed a black-box warning on both tacrolimus ointment and pimecrolimus cream in 2006 based on animal studies using the medications. Some animals developed skin cancer and lymphoma. Subsequently, the FDA released a statement that there is no causal relationship between the two medications and cancer. Because of this concern, there are ongoing studies to evaluate this relationship in humans. So far, studies support the safety of these medications.  One showed that the rates of cancer in patients using these medications topically were less than the rates of the general population; several studies have shown that the medicines are undetectable in the blood, even in children using the medication over a large area of the body.  3. TREATING THE [facility] Tell your physician if your child is very itchy or if the itch is affecting the ability to sleep. Oral anti-itch medicines (antihistamines) can be helpful for inducing sleep, but usually do not reduce the itch and scratching.  4. AVOIDING TRIGGERS Some children have specific things that trigger episodes of itchiness and rashes, while others may have none that can be identified. Triggers may even change over time. Common triggers include: excessive bathing without moisturization, low humidity, cigarette or wood smoke exposure, emotional stress, sweat, friction and overheating of skin, and exposure to certain products such as wool, harsh soaps, fragrance, bubble baths, and laundry detergents. Many parents and physicians consider allergy testing to identify possible triggers that could be avoided. There is limited utility for specific Immunoglobulin E (IgE) levels; if food allergy is being considered as a trigger for the dermatitis (which is unusual), specific IgE levels are, at best, a guideline of potential allergic triggers and require food challenge testing to further consider the possibility.   5. RECOGNIZING INFECTIONS AS A TRIGGER Because the skin barrier is compromised, individuals with atopic dermatitis can also develop infections on the skin from bacteria, viruses, or fungi. The most common infection is from Staphylococcus aureus bacteria, which should be suspected when the skin develops honey-colored crusts, or appears  raw and weepy. Infected skin may result in a worsening of the atopic dermatitis and may not respond to standard therapy. Diluted bleach baths can be helpful to reduce infection by S.  aureus and thereby help better control atopic dermatitis. Some patients require oral and/or topical antibiotics or antiviral medications for these types of flares. Patients with atopic dermatitis may also be at risk for the spread on the skin of herpes virus; therefore, family and friends with a known or suspected history of herpes virus (cold sores, fever blisters, etc.) should avoid contacting patients with atopic dermatitis when they are having an active outbreak.   Contributing SPD Members: Alan Edin, MD, Deatrice Susette Bathe, MD, Margaretann Hasten, MD, Lauraine Favorite, MD, Scarlett Ness, MD, Cydney Robins, MD  Committee Reviewers: Daphne Sickles, MD, Prentice Grate, MD  Expert Reviewer: Greig Galley, MD  The Society for Pediatric Dermatology and [employer] cannot be held responsible for any errors or for any consequences arising from the use of the information contained in this handout. Handout originally published in Pediatric Dermatology: Vol. 33, No. 1 (2016).   2016 The Society for Pediatric Dermatology   Moisturizer: Apply a moisturizer throughout the day and after bathing.  When you moisturize after bathing, this locks in the moisture.  This can lead to softer and smoother skin.  Body moisturizers come in ointments, creams, and lotions.  If you have dry skin, we recommend the use of ointments or creams rather than lotions.  In other words, something you scoop out of a jar rather than squirted out.  Ointments and creams are thicker and thus provide better moisturization.        Sunscreen  Who needs sunscreen? Everyone. Sunscreen use can help prevent skin cancer by protecting you from the sun's harmful ultraviolet rays. Anyone can get skin cancer, regardless of age, gender or race. In fact, it is estimated that one in five Americans will develop skin cancer in their lifetime.  Sunscreen alone cannot fully protect you. In addition to wearing sunscreen, dermatologists  recommend taking the following steps to protect your skin and find skin cancer early:  Seek shade when appropriate, remembering that the sun's rays are strongest between 10 a.m. and 2 p.m. If your shadow is shorter than you are, seek shade. Dress to protect yourself from the sun by wearing a lightweight long-sleeved shirt, pants, a wide-brimmed hat and sunglasses, when possible.  Use extra caution near water, snow and sand as they reflect the damaging rays of the sun, which can increase your chance of sunburn.  Get vitamin D safely through a healthy diet that may include vitamin supplements. Don't seek the sun. Avoid tanning beds. Ultraviolet light from the sun and tanning beds can cause skin cancer and wrinkling. If you want to look tan, you may wish to use a self-tanning product, but continue to use sunscreen with it.  When should I use sunscreen? Every day you go outside--even if you're just walking to and from your form of transportation. The sun emits harmful UV rays year-round. Even on cloudy days, up to 80 percent of the sun's harmful UV rays can penetrate your skin. Snow, sand and water increase the need for sunscreen because they reflect the sun's rays.  How much sunscreen should I use, and how often should I apply it? Most people only apply 25-50 percent of the recommended amount of sunscreen. Apply enough sunscreen to cover all exposed skin. Most adults need about 1 ounce -- or enough to  fill a shot glass -- to fully cover their body.  Don't forget to apply to the tops of your feet, your neck, your ears and the top of your head. Apply sunscreen to dry skin 15 minutes before going outdoors.  Skin cancer also can form on the lips. To protect your lips, apply a lip balm or lipstick that contains sunscreen with an SPF of 30 or higher.  When outdoors, reapply sunscreen approximately every two hours, or after swimming or sweating, according to the directions on the bottle.   Broad-spectrum  sunscreens protect against both UVA and UVB rays. What is the difference between the rays? Sunlight consists of two types of harmful rays that reach the earth -- UVA rays and UVB rays. Overexposure to either can lead to skin cancer. In addition to causing skin cancer, here's what each of these rays do:  UVA rays (or aging rays) can prematurely age your skin, causing wrinkles and age spots, and can pass through window glass. UVB rays (or burning rays) are the primary cause of sunburn and are blocked by window glass  There is no safe way to tan. Every time you tan, you damage your skin. As this damage builds, you speed up the aging of your skin and increase your risk for all types of skin cancer.  What is the difference between chemical and physical sunscreens? Chemical sunscreens work like a sponge, absorbing the sun's rays. They contain one or more of the following active ingredients: oxybenzone, avobenzone, octisalate, octocrylene, homosalate and octinoxate. These formulations tend to be easier to rub into the skin without leaving a white residue.   Physical sunscreens work like a shield, sitting sit on the surface of your skin and deflecting the sun's rays. They contain the active ingredients zinc oxide and/or titanium dioxide. Use this sunscreen if you have sensitive skin.   What type of sunscreen should I use? The best type of sunscreen is the one you will use again and again. Just make sure it offers broad-spectrum (UVA and UVB) protection, has an SPF of 30+, and is water-resistant. The kind of sunscreen you use is a matter of personal choice, and may vary depending on the area of the body to be protected. Available sunscreen options include lotions, creams, gels, ointments, wax sticks and sprays.  Recommended physical sunscreens for face: - Neutrogena Sheer Zinc - Aveeno Positively Mineral Sensitive - CeraVe Hydrating Mineral (also has a tinted version) - La Roche-Posay Anthelios Mineral Face  (comes as a cream, lotion, light fluid, and there is also a tinted version).  - EltaMD UV Clear (also has a tinted version)  Recommended physical sunscreens for body: - Neutrogena Sheer Zinc Dry-Touch Sunscreen Sensitive Skin Lotion Broad Spectrum SPF 50 - Aveeno Positively Mineral Sensitive Skin Sunscreen Broad Spectrum SPF 50 - La Roche-Posay Anthelios SPF 50 Mineral Sunscreen - Gentle Lotion - CeraVe Hydrating Mineral Sunscreen SPF 50  Recommended chemical sunscreens for face: - Anthelios UV Correct Face Sunscreen SPF 70 with Niacinamide - Neutrogena Clear Face Oil-Free SPF 50 with Helioplex - Neutrogena Sport Face Oil-Free SPF 70+ with Helioplex - Aveeno Protect + Hydrate Sunscreen For Face SPF 70 - La Roche-Posay Anthelios Light Fluid Sunscreen for Face SPF 60  Recommended chemical sunscreens for body: - Neutrogena Ultra Sheer Dry-Touch Sunscreen SPF 70 - Aveeno Protect + Hydrate Broad Spectrum All-Day Hydration SPF 60 (comes in a big pump) - La Roche-Posay Anthelios Melt-In Milk Sunscreen SPF 60    Due to recent changes  in healthcare laws, you may see results of your pathology and/or laboratory studies on MyChart before the doctors have had a chance to review them. We understand that in some cases there may be results that are confusing or concerning to you. Please understand that not all results are received at the same time and often the doctors may need to interpret multiple results in order to provide you with the best plan of care or course of treatment. Therefore, we ask that you please give us  2 business days to thoroughly review all your results before contacting the office for clarification. Should we see a critical lab result, you will be contacted sooner.   If You Need Anything After Your Visit  If you have any questions or concerns for your doctor, please call our main line at [phone] and press option 4 to reach your doctor's medical assistant. If no one answers,  please leave a voicemail as directed and we will return your call as soon as possible. Messages left after 4 pm will be answered the following business day.   You may also send us  a message via MyChart. We typically respond to MyChart messages within 1-2 business days.  For prescription refills, please ask your pharmacy to contact our office. Our fax number is [phone].  If you have an urgent issue when the clinic is closed that cannot wait until the next business day, you can page your doctor at the number below.    Please note that while we do our best to be available for urgent issues outside of office hours, we are not available 24/7.   If you have an urgent issue and are unable to reach us , you may choose to seek medical care at your doctor's office, retail clinic, urgent care center, or emergency room.  If you have a medical emergency, please immediately call 911 or go to the emergency department.  Pager Numbers  - Dr. Hester: [phone]  - Dr. Jackquline: [phone]  - Dr. Claudene: [phone]   In the event of inclement weather, please call our main line at [phone] for an update on the status of any delays or closures.  Dermatology Medication Tips: Please keep the boxes that topical medications come in in order to help keep track of the instructions about where and how to use these. Pharmacies typically print the medication instructions only on the boxes and not directly on the medication tubes.   If your medication is too expensive, please contact our office at [phone] option 4 or send us  a message through MyChart.   We are unable to tell what your co-pay for medications will be in advance as this is different depending on your insurance coverage. However, we may be able to find a substitute medication at lower cost or fill out paperwork to get insurance to cover a needed medication.   If a prior authorization is required to get your medication covered by your  insurance company, please allow us  1-2 business days to complete this process.  Drug prices often vary depending on where the prescription is filled and some pharmacies may offer cheaper prices.  The website www.goodrx.com contains coupons for medications through different pharmacies. The prices here do not account for what the cost may be with help from insurance (it may be cheaper with your insurance), but the website can give you the price if you did not use any insurance.  - You can print the associated coupon and take it with your prescription  to the pharmacy.  - You may also stop by our office during regular business hours and pick up a GoodRx coupon card.  - If you need your prescription sent electronically to a different pharmacy, notify our office through [facility] or by phone at [phone] option 4.     Si Usted Necesita Algo Despus de Su Visita  Tambin puede enviarnos un mensaje a travs de Clinical cytogeneticist. Por lo general respondemos a los mensajes de MyChart en el transcurso de 1 a 2 das hbiles.  Para renovar recetas, por favor pida a su farmacia que se ponga en contacto con nuestra oficina. Randi lakes de fax es [place] [phone].  Si tiene un asunto urgente cuando la clnica est cerrada y que no puede esperar hasta el siguiente da hbil, puede llamar/localizar a su doctor(a) al nmero que aparece a continuacin.   Por favor, tenga en cuenta que aunque hacemos todo lo posible para estar disponibles para asuntos urgentes fuera del horario de [place], no estamos disponibles las 24 horas del da, los 7 [address]  [address] de la [place].   Si tiene un problema urgente y no puede comunicarse con nosotros, puede optar por buscar atencin mdica  en el consultorio de su doctor(a), en una clnica privada, en un centro de atencin urgente o en una sala de emergencias.  Si tiene Engineer, drilling, por favor llame inmediatamente al 911 o vaya a la sala de emergencias.  Nmeros de  bper  - Dr. Hester: [phone]  - Dra. Jackquline: [phone]  - Dr. Claudene: [phone]   En caso de inclemencias del tiempo, por favor llame a landry capes principal al [phone] para una actualizacin sobre el [place] de cualquier retraso o cierre.  Consejos para la medicacin en dermatologa: Por favor, guarde las cajas en las que vienen los medicamentos de uso tpico para ayudarle a seguir las instrucciones sobre dnde y cmo usarlos. Las farmacias generalmente imprimen las instrucciones del medicamento slo en las cajas y no directamente en los tubos del [place].   Si su medicamento es muy caro, por favor, pngase en contacto con landry rieger llamando al [phone] y presione la opcin 4 o envenos un mensaje a travs de Clinical cytogeneticist.   No podemos decirle cul ser su copago por los medicamentos por adelantado ya que esto es diferente dependiendo de la cobertura de su seguro. Sin embargo, es posible que podamos encontrar un medicamento sustituto a Audiological scientist un formulario para que el seguro cubra el medicamento que se considera necesario.   Si se requiere una autorizacin previa para que su compaa de seguros malta su medicamento, por favor permtanos de 1 a 2 das hbiles para completar este proceso.  Los precios de los medicamentos varan con frecuencia dependiendo del Environmental consultant de dnde se surte la receta y alguna farmacias pueden ofrecer precios ms baratos.  El sitio web www.goodrx.com tiene cupones para medicamentos de Health and safety inspector. Los precios aqu no tienen en cuenta lo que podra costar con la ayuda del seguro (puede ser ms barato con su seguro), pero el sitio web puede darle el precio si no utiliz Tourist information centre manager.  - Puede imprimir el cupn correspondiente y llevarlo con su receta a la farmacia.  - Tambin puede pasar por nuestra oficina durante el horario de atencin regular y Education officer, museum una tarjeta de cupones de GoodRx.  - Si necesita que su receta se  enve electrnicamente a Psychiatrist, informe a nuestra oficina a travs de MyChart de [employer] o  por telfono llamando al [phone] y presione la opcin 4.

## 2023-12-03 NOTE — Progress Notes (Signed)
 Subjective   Kristine Parks is a 89 y.o. female who presents for the following: Rash. Patient is new patient  Today patient reports: Patient here for itchy rashes at arms and black dots at face, really itchy x1 month. Patient was prescribed triamcinolone 0.1% cream by PCP, does help some, has been using BID on and off.   Also dark spots on face   This patient is accompanied in the office by her grandson and is interpreting.   Review of Systems:    No other skin or systemic complaints except as noted in HPI or Assessment and Plan.  The following portions of the chart were reviewed this encounter and updated as appropriate: medications, allergies, medical history  Relevant Medical History:  n/a   Objective  Well appearing patient in no apparent distress; mood and affect are within normal limits. Examination was performed of the: Focused Exam of: Arms, hips, abdomen, back, face   Examination notable for: Lentigo/lentigines: Scattered pigmented macules that are tan to brown in color and are somewhat non-uniform in shape and concentrated in the sun-exposed areas  Significant actinic damage Actinic purpura Faint erythematous patches on arm, occasional prurigo  Examination limited by: Undergarments, Shoes or socks , Clothing, and Patient deferred removal                Assessment & Plan   Pruritic eruption bilateral lower extremities - favor eczema/prurigo nodularis   Chronic and persistent condition with duration or expected duration over one year. Condition is symptomatic and bothersome to patient. Patient is flaring and not currently at treatment goal.  - Differential diagnosis, treatment options, prognosis, risk/ benefit, and side effects of treatment were discussed with the patient.  - possible triggers - amlodipine, CKD  - Reviewed benign but chronic nature of disease. - Discussed dry skin care at length, recommended avoidance of fragrances, short showers with  luke- warm water, no scrubbing, an unscented moisturizing soap (e.g. Dove sensitive skin) limited to the groin and axillae, and frequent emollient use (Eucerin, Aquaphor, Cerave, Vanicream, Vaseline). - Discussed treatment with topical steroids, non steroidal topicals, systemics (dupixent, tralokinumab, nemolizumab, rinvoq)   - Cyclosporine, mycophenolate, azathioprine, methotrexate are older medications but can be considered  - Reviewed proper use of topical steroids to minimize the risk of steroid-induced skin changes.  - Also discussed appropriate dry skin care including daily warm baths with gentle soap, followed by liberal bland moisturizer application.  - A patient education handout reiterating this information was provided. - For mild areas: start triamcinolone 0.1% ointment twice daily - For severe areas: start clobetasol 0.5% ointment twice daily - For areas on face: start tacrolimus 0.1% ointment twice daily - Recommend gentle skin care, only Vaseline to arms.  - If not improved at follow-up advised patient may consider injectable and biopsy.    Benign Lesions/ Findings:  -Lentigo's - Actinic / solar purpura  - Reassurance provided regarding the benign appearance of lesions noted on exam today; no treatment is indicated in the absence of symptoms/changes. - Reinforced importance of photoprotective strategies including liberal and frequent sunscreen use of a broad-spectrum SPF 30 or greater, use of protective clothing, and sun avoidance for prevention of cutaneous malignancy and photoaging.  Counseled patient on the importance of regular self-skin monitoring as well as routine clinical skin examinations as scheduled.     Level of service outlined above   Procedures, orders, diagnosis for this visit:    There are no diagnoses linked to this encounter.  Return to clinic: Return for 4-5 weeks rash follow-up.  I, Jacquelynn V. Wilfred, CMA, am acting as scribe for Lauraine JAYSON Kanaris, MD  .   Documentation: I have reviewed the above documentation for accuracy and completeness, and I agree with the above.  Lauraine JAYSON Kanaris, MD

## 2024-01-04 ENCOUNTER — Ambulatory Visit
